# Patient Record
Sex: Female | Born: 2005
Health system: Southern US, Community
[De-identification: ages and names within clinical notes are randomized; demographics above are authoritative.]

## PROBLEM LIST (undated history)

## (undated) ENCOUNTER — Emergency Department: Discharge status: Absent without leave | Payer: Self-pay

## (undated) DIAGNOSIS — S8991XA Unspecified injury of right lower leg, initial encounter: Secondary | ICD-10-CM

## (undated) DIAGNOSIS — F329 Major depressive disorder, single episode, unspecified: Secondary | ICD-10-CM

## (undated) DIAGNOSIS — F32A Depression, unspecified: Secondary | ICD-10-CM

## (undated) DIAGNOSIS — F419 Anxiety disorder, unspecified: Secondary | ICD-10-CM

## (undated) DIAGNOSIS — R519 Headache, unspecified: Secondary | ICD-10-CM

## (undated) DIAGNOSIS — J45909 Unspecified asthma, uncomplicated: Secondary | ICD-10-CM

## (undated) HISTORY — DX: Unspecified injury of right lower leg, initial encounter: S89.91XA

## (undated) HISTORY — DX: Headache, unspecified: R51.9

## (undated) HISTORY — DX: Depression, unspecified: F32.A

## (undated) HISTORY — DX: Anxiety disorder, unspecified: F41.9

---
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
## 2023-01-13 ENCOUNTER — Telehealth: Payer: Self-pay

## 2023-01-13 NOTE — Telephone Encounter (Signed)
LVM for patient to call back [phone], or to call PCP office to schedule follow up apt. AS, CMA  

## 2023-01-20 ENCOUNTER — Other Ambulatory Visit: Payer: Self-pay | Admitting: Family Medicine

## 2023-01-20 DIAGNOSIS — F902 Attention-deficit hyperactivity disorder, combined type: Secondary | ICD-10-CM

## 2023-04-29 ENCOUNTER — Encounter: Payer: Self-pay | Admitting: Family Medicine

## 2023-04-29 ENCOUNTER — Telehealth (INDEPENDENT_AMBULATORY_CARE_PROVIDER_SITE_OTHER): Payer: BC Managed Care – PPO | Admitting: Family Medicine

## 2023-04-29 VITALS — Ht 68.0 in | Wt 220.0 lb

## 2023-04-29 DIAGNOSIS — N920 Excessive and frequent menstruation with regular cycle: Secondary | ICD-10-CM | POA: Diagnosis not present

## 2023-04-29 DIAGNOSIS — Z23 Encounter for immunization: Secondary | ICD-10-CM

## 2023-04-29 DIAGNOSIS — F902 Attention-deficit hyperactivity disorder, combined type: Secondary | ICD-10-CM

## 2023-04-29 DIAGNOSIS — R21 Rash and other nonspecific skin eruption: Secondary | ICD-10-CM

## 2023-04-29 MED ORDER — DESOGESTREL-ETHINYL ESTRADIOL 0.15-0.02/0.01 MG (21/5) PO TABS
1.0000 | ORAL_TABLET | Freq: Every day | ORAL | 4 refills | Status: AC
Start: 2023-04-29 — End: ?

## 2023-04-29 MED ORDER — LISDEXAMFETAMINE DIMESYLATE 20 MG PO CAPS
20.0000 mg | ORAL_CAPSULE | Freq: Every day | ORAL | 0 refills | Status: AC
Start: 2023-05-28 — End: ?

## 2023-04-29 MED ORDER — KETOCONAZOLE 2 % EX CREA
1.0000 | TOPICAL_CREAM | Freq: Two times a day (BID) | CUTANEOUS | 0 refills | Status: AC
Start: 2023-04-29 — End: ?

## 2023-04-29 MED ORDER — LISDEXAMFETAMINE DIMESYLATE 20 MG PO CAPS: 20.0000 mg | ORAL_CAPSULE | Freq: Every day | ORAL | 0 refills | Status: AC

## 2023-04-29 MED ORDER — LISDEXAMFETAMINE DIMESYLATE 20 MG PO CAPS: 20.0000 mg | ORAL_CAPSULE | Freq: Every day | ORAL | 0 refills | Status: DC

## 2023-04-29 NOTE — Progress Notes (Signed)
Virtual Visit via Video Note  I connected with Kelly Ball on 04/29/23 at 10:50 AM EDT by a video enabled telemedicine application and verified that I am speaking with the correct person using two identifiers.   I discussed the limitations of evaluation and management by telemedicine and the availability of in person appointments. The patient expressed understanding and agreed to proceed.  Patient location: at home Provider location: in office  Subjective:    CC:   Chief Complaint  Patient presents with   ADHD   Contraception    HPI: She would really like to start her ADHD medication.  She has not been on it in about a year.  She was previously on Vyvanse 20 mg and did well she never really had any major side effects or concerns  Also interested in restarting birth control.  She tried something years ago but it affected her she does not quite remember the details but she is interested in trying a pill again her periods have been really heavy and she is also noticed some increase in depression symptoms around that time.  She is back in college and studying full-time.  She also has a little spot on her skin underneath the breast area that she says the skin just looks a little different it occasionally is mildly itchy but most the time it is not bothersome but it has not gone away.   Past medical history, Surgical history, Family history not pertinant except as noted below, Social history, Allergies, and medications have been entered into the medical record, reviewed, and corrections made.    Objective:    General: Speaking clearly in complete sentences without any shortness of breath.  Alert and oriented x3.  Normal judgment. No apparent acute distress.    Impression and Recommendations:    Problem List Items Addressed This Visit       Other   Menorrhagia with regular cycle   Relevant Medications   desogestrel-ethinyl estradiol (KARIVA) 0.15-0.02/0.01 MG (21/5)  tablet   Attention deficit hyperactivity disorder, combined type   Relevant Medications   lisdexamfetamine (VYVANSE) 20 MG capsule   lisdexamfetamine (VYVANSE) 20 MG capsule   lisdexamfetamine (VYVANSE) 20 MG capsule (Start on 05/28/2023)   Other Visit Diagnoses     Encounter for immunization    -  Primary   Relevant Orders   Flu vaccine trivalent PF, 6mos and older(Flulaval,Afluria,Fluarix,Fluzone) (Completed)   Rash       Relevant Medications   ketoconazole (NIZORAL) 2 % cream       Orders Placed This Encounter  Procedures   Flu vaccine trivalent PF, 6mos and older(Flulaval,Afluria,Fluarix,Fluzone)    Meds ordered this encounter  Medications   lisdexamfetamine (VYVANSE) 20 MG capsule    Sig: Take 1 capsule (20 mg total) by mouth daily.    Dispense:  30 capsule    Refill:  0   lisdexamfetamine (VYVANSE) 20 MG capsule    Sig: Take 1 capsule (20 mg total) by mouth daily.    Dispense:  30 capsule    Refill:  0   DISCONTD: lisdexamfetamine (VYVANSE) 20 MG capsule    Sig: Take 1 capsule (20 mg total) by mouth daily.    Dispense:  30 capsule    Refill:  0   desogestrel-ethinyl estradiol (KARIVA) 0.15-0.02/0.01 MG (21/5) tablet    Sig: Take 1 tablet by mouth daily.    Dispense:  84 tablet    Refill:  4   ketoconazole (NIZORAL) 2 %  cream    Sig: Apply 1 Application topically 2 (two) times daily. X 2-4 weeks    Dispense:  15 g    Refill:  0   lisdexamfetamine (VYVANSE) 20 MG capsule    Sig: Take 1 capsule (20 mg total) by mouth daily.    Dispense:  30 capsule    Refill:  0    I discussed the assessment and treatment plan with the patient. The patient was provided an opportunity to ask questions and all were answered. The patient agreed with the plan and demonstrated an understanding of the instructions.   The patient was advised to call back or seek an in-person evaluation if the symptoms worsen or if the condition fails to improve as anticipated.  I spent 20 minutes  on the day of the encounter to include pre-visit record review, face-to-face time with the patient and post visit ordering of test.   Nani Gasser, MD

## 2023-04-29 NOTE — Progress Notes (Signed)
Pt would like to restart the Vyvanse 20 mg. She needs these to focus for school  She would like to start taking OPC's again. She reports that she had tried taking birth control in the past however, it caused her depression to be worse. Could not find this on her previous medication record. She stated that she was prescribe these by her pediatrician.

## 2023-11-04 ENCOUNTER — Ambulatory Visit: Payer: Self-pay

## 2023-11-04 NOTE — Telephone Encounter (Signed)
 Patient scheduled in office 11/05/23 with Tandy Gaw

## 2023-11-04 NOTE — Telephone Encounter (Signed)
 Chief Complaint: fever Symptoms: fever 101.33F, cough, congestion, ear pain, throat pain Frequency: since yesterday Pertinent Negatives: Patient denies SOB, CP, N/V/D/abdominal pain Disposition: [] ED /[] Urgent Care (no appt availability in office) / [x] Appointment(In office/virtual)/ []  [facility]/ [] Home Care/ [] Refused Recommended Disposition /[] [facility]/ []  Follow-up with PCP Additional Notes: Pt reports fever, dry cough, congestion, ear pain, and throat pain since yesterday. Temp of 101.33F today. Using ibuprofen and Nighttime cold and flu which she states helps. Bilateral ear pain goes and comes. Endorses sore throat with no pus to the tonsils. Pt is still able to swallow. No appetite but is drinking liquids. RN scheduled pt for tomorrow at 0930 in the office, pt agreeable. RN advised pt she needs to call back with any worsening, pt verbalized understanding.     Copied from CRM [id number]. Topic: Clinical - Red Word Triage >> Nov 04, 2023 12:13 PM Everette Rank wrote: Red Word that prompted transfer to Nurse Triage: Patient headache/Fever 101.4-/body chills  since this morning Reason for Disposition  Earache  (Exceptions: brief ear pain of < 60 minutes duration, earache occurring during air travel  Answer Assessment - Initial Assessment Questions 1. TEMPERATURE: "What is the most recent temperature?"  "How was it measured?"      101.33F - 15 mins ago 2. ONSET: "When did the fever start?"      Today 3. CHILLS: "Do you have chills?" If yes: "How bad are they?"  (e.g., none, mild, moderate, severe)   - NONE: no chills   - MILD: feeling cold   - MODERATE: feeling very cold, some shivering (feels better under a thick blanket)   - SEVERE: feeling extremely cold with shaking chills (general body shaking, rigors; even under a thick blanket)      "Switches from sweating to chills" - mild 4. OTHER SYMPTOMS: "Do you have any other symptoms besides the fever?"  (e.g., abdomen pain,  cough, diarrhea, earache, headache, sore throat, urination pain)     Cough since yesterday - states she starts coughing and can't stop, dry cough. Diff breathing with congestion. Starts gagging with coughing sometimes but no vomiting. Endorses headache. Endorses bilateral earache and sore throat. No pus on the tonsils. No appetite but states she's drinking water and can swallow. Denies N/V/D/abd pain. 5. CAUSE: If there are no symptoms, ask: "What do you think is causing the fever?"      Sister had cough but never got a fever 6. CONTACTS: "Does anyone else in the family have an infection?"     Sister 7. TREATMENT: "What have you done so far to treat this fever?" (e.g., medications)     Took ibuprofen earlier this AM, pt states she fell asleep afterwards. Took Nighttime cold and flu last night, said that helped. 8. IMMUNOCOMPROMISE: "Do you have of the following: diabetes, HIV positive, splenectomy, cancer chemotherapy, chronic steroid treatment, transplant patient, etc."     No  Answer Assessment - Initial Assessment Questions 1. LOCATION: "Which ear is involved?"     Bilateral ears 2. ONSET: "When did the ear start hurting"      Intermittent - started this AM 3. SEVERITY: "How bad is the pain?"  (Scale 1-10; mild, moderate or severe)   - MILD (1-3): doesn't interfere with normal activities    - MODERATE (4-7): interferes with normal activities or awakens from sleep    - SEVERE (8-10): excruciating pain, unable to do any normal activities      4/10 4. URI  SYMPTOMS: "Do you have a runny nose or cough?"     Coughing to the point of gagging, congestion, fever 5. FEVER: "Do you have a fever?" If Yes, ask: "What is your temperature, how was it measured, and when did it start?"     101.47F 6. CAUSE: "Have you been swimming recently?", "How often do you use Q-TIPS?", "Have you had any recent air travel or scuba diving?"     No 7. OTHER SYMPTOMS: "Do you have any other symptoms?" (e.g., headache,  stiff neck, dizziness, vomiting, runny nose, decreased hearing)     Headache, runny nose  Protocols used: Fever-A-AH, Earache-A-AH

## 2023-11-05 ENCOUNTER — Ambulatory Visit: Payer: Self-pay | Admitting: Physician Assistant

## 2023-11-05 VITALS — BP 116/80 | HR 99 | Temp 97.8°F | Ht 68.0 in | Wt 223.0 lb

## 2023-11-05 DIAGNOSIS — U071 COVID-19: Secondary | ICD-10-CM | POA: Diagnosis not present

## 2023-11-05 DIAGNOSIS — R6889 Other general symptoms and signs: Secondary | ICD-10-CM | POA: Diagnosis not present

## 2023-11-05 LAB — POCT INFLUENZA A/B
Influenza A, POC: NEGATIVE
Influenza B, POC: NEGATIVE

## 2023-11-05 LAB — POCT RAPID STREP A (OFFICE): Rapid Strep A Screen: NEGATIVE

## 2023-11-05 LAB — POC COVID19 BINAXNOW: SARS Coronavirus 2 Ag: POSITIVE — AB

## 2023-11-05 MED ORDER — NIRMATRELVIR/RITONAVIR (PAXLOVID)TABLET
3.0000 | ORAL_TABLET | Freq: Two times a day (BID) | ORAL | 0 refills | Status: AC
Start: 2023-11-05 — End: 2023-11-10

## 2023-11-05 MED ORDER — PROMETHAZINE-DM 6.25-15 MG/5ML PO SYRP
5.0000 mL | ORAL_SOLUTION | Freq: Four times a day (QID) | ORAL | 0 refills | Status: AC | PRN
Start: 2023-11-05 — End: ?

## 2023-11-05 NOTE — Progress Notes (Signed)
 Acute Office Visit  Subjective:     Patient ID: Kelly Ball, female    DOB: September 02, 2005, 18 y.o.   MRN: [id number]  Chief Complaint  Patient presents with   Cough    Flu like symptoms 3/4 days     HPI Patient is in today for 3 days of fever, body aches, cough, sinus pressure, ear fullness. She has taken ibuprofen for fever and body aches and helped some. Symptoms started with cough. She has had a sick sister with cough but not diagnosed with anything else. Not had covid booster or annual flu vaccine.    ROS See HPI.      Objective:    BP 116/80   Pulse 99   Temp 97.8 F (36.6 C) (Oral)   Ht 5\' 8"  (1.727 m)   Wt 223 lb (101.2 kg)   SpO2 99%   BMI 33.91 kg/m  BP Readings from Last 3 Encounters:  11/08/23 116/80  05/19/22 123/73  07/07/21 (!) 111/64 (51%, Z = 0.03 /  39%, Z = -0.28)*   *BP percentiles are based on the 2017 AAP Clinical Practice Guideline for girls   Wt Readings from Last 3 Encounters:  11/08/23 223 lb (101.2 kg) (99%, Z= 2.25)*  04/29/23 220 lb (99.8 kg) (99%, Z= 2.21)*  05/19/22 222 lb (100.7 kg) (99%, Z= 2.22)*   * Growth percentiles are based on CDC (Girls, 2-20 Years) data.    .. Results for orders placed or performed in visit on 11/05/23  POC COVID-19   Collection Time: 11/05/23 11:11 AM  Result Value Ref Range   SARS Coronavirus 2 Ag Positive (A) Negative  POCT Influenza A/B   Collection Time: 11/05/23 11:12 AM  Result Value Ref Range   Influenza A, POC Negative Negative   Influenza B, POC Negative Negative  POCT rapid strep A   Collection Time: 11/05/23 11:13 AM  Result Value Ref Range   Rapid Strep A Screen Negative Negative     Physical Exam Constitutional:      Appearance: She is ill-appearing.  HENT:     Head: Normocephalic.     Right Ear: Tympanic membrane, ear canal and external ear normal. There is no impacted cerumen.     Left Ear: Tympanic membrane, ear canal and external ear normal. There is no impacted cerumen.      Nose: Rhinorrhea present.     Mouth/Throat:     Mouth: Mucous membranes are moist.     Pharynx: Posterior oropharyngeal erythema present. No oropharyngeal exudate.  Eyes:     Extraocular Movements: Extraocular movements intact.     Conjunctiva/sclera: Conjunctivae normal.     Pupils: Pupils are equal, round, and reactive to light.  Cardiovascular:     Rate and Rhythm: Normal rate and regular rhythm.  Pulmonary:     Effort: Pulmonary effort is normal.     Breath sounds: Normal breath sounds.  Musculoskeletal:     Cervical back: Normal range of motion. Tenderness present.     Right lower leg: No edema.     Left lower leg: No edema.  Lymphadenopathy:     Cervical: Cervical adenopathy present.  Neurological:     General: No focal deficit present.     Mental Status: She is alert and oriented to person, place, and time.          Assessment & Plan:  Marland KitchenMarland KitchenDon was seen today for cough.  Diagnoses and all orders for this visit:  COVID-19 -  nirmatrelvir/ritonavir (PAXLOVID) 20 x 150 MG & 10 x 100MG  TABS; Take 3 tablets by mouth 2 (two) times daily for 5 days. Patient GFR is above 60. -     promethazine-dextromethorphan (PROMETHAZINE-DM) 6.25-15 MG/5ML syrup; Take 5 mLs by mouth 4 (four) times daily as needed.  Flu-like symptoms -     POCT rapid strep A -     POCT Influenza A/B -     POC COVID-19   Day 3 of covid Started paxlovid, discussed side effects Encouraged symptomatic care, given cough syrup Written out of work through Monday Rest and hydrate  Tandy Gaw, PA-C

## 2023-11-05 NOTE — Patient Instructions (Signed)

## 2023-11-08 ENCOUNTER — Encounter: Payer: Self-pay | Admitting: Physician Assistant
